# Patient Record
Sex: Female | Born: 1950 | Race: White | Hispanic: No | Marital: Married | Smoking: Never smoker
Health system: Southern US, Community
[De-identification: ages and names within clinical notes are randomized; demographics above are authoritative.]

## PROBLEM LIST (undated history)

## (undated) DIAGNOSIS — I10 Essential (primary) hypertension: Secondary | ICD-10-CM

## (undated) DIAGNOSIS — E785 Hyperlipidemia, unspecified: Secondary | ICD-10-CM

## (undated) HISTORY — DX: Hyperlipidemia, unspecified: E78.5

## (undated) HISTORY — DX: Essential (primary) hypertension: I10

## (undated) HISTORY — PX: VAGINAL HYSTERECTOMY: SUR661

---

## 2000-07-14 ENCOUNTER — Encounter: Admission: RE | Admit: 2000-07-14 | Discharge: 2000-07-14 | Payer: Self-pay | Admitting: Obstetrics and Gynecology

## 2000-07-14 ENCOUNTER — Encounter: Payer: Self-pay | Admitting: Obstetrics and Gynecology

## 2000-08-03 ENCOUNTER — Other Ambulatory Visit: Admission: RE | Admit: 2000-08-03 | Discharge: 2000-08-03 | Payer: Self-pay | Admitting: Obstetrics and Gynecology

## 2001-08-17 ENCOUNTER — Encounter: Payer: Self-pay | Admitting: Obstetrics and Gynecology

## 2001-08-17 ENCOUNTER — Encounter: Admission: RE | Admit: 2001-08-17 | Discharge: 2001-08-17 | Payer: Self-pay | Admitting: Obstetrics and Gynecology

## 2002-04-19 ENCOUNTER — Encounter: Payer: Self-pay | Admitting: Family Medicine

## 2002-04-19 ENCOUNTER — Encounter: Admission: RE | Admit: 2002-04-19 | Discharge: 2002-04-19 | Payer: Self-pay | Admitting: Obstetrics and Gynecology

## 2002-10-06 ENCOUNTER — Other Ambulatory Visit: Admission: RE | Admit: 2002-10-06 | Discharge: 2002-10-06 | Payer: Self-pay | Admitting: Obstetrics and Gynecology

## 2003-11-01 ENCOUNTER — Ambulatory Visit (HOSPITAL_COMMUNITY): Admission: RE | Admit: 2003-11-01 | Discharge: 2003-11-01 | Payer: Self-pay | Admitting: Gastroenterology

## 2004-06-07 ENCOUNTER — Encounter: Admission: RE | Admit: 2004-06-07 | Discharge: 2004-06-07 | Payer: Self-pay | Admitting: Family Medicine

## 2005-07-21 ENCOUNTER — Encounter: Admission: RE | Admit: 2005-07-21 | Discharge: 2005-07-21 | Payer: Self-pay | Admitting: Family Medicine

## 2005-08-05 ENCOUNTER — Encounter: Admission: RE | Admit: 2005-08-05 | Discharge: 2005-08-05 | Payer: Self-pay | Admitting: Family Medicine

## 2006-09-21 ENCOUNTER — Encounter: Admission: RE | Admit: 2006-09-21 | Discharge: 2006-09-21 | Payer: Self-pay | Admitting: Family Medicine

## 2007-10-08 ENCOUNTER — Encounter: Admission: RE | Admit: 2007-10-08 | Discharge: 2007-10-08 | Payer: Self-pay | Admitting: Family Medicine

## 2008-09-11 ENCOUNTER — Emergency Department (HOSPITAL_COMMUNITY): Admission: EM | Admit: 2008-09-11 | Discharge: 2008-09-11 | Payer: Self-pay | Admitting: Emergency Medicine

## 2008-11-01 ENCOUNTER — Encounter: Admission: RE | Admit: 2008-11-01 | Discharge: 2008-11-01 | Payer: Self-pay | Admitting: Family Medicine

## 2009-11-16 ENCOUNTER — Encounter: Admission: RE | Admit: 2009-11-16 | Discharge: 2009-11-16 | Payer: Self-pay | Admitting: Family Medicine

## 2009-11-19 ENCOUNTER — Encounter: Admission: RE | Admit: 2009-11-19 | Discharge: 2009-11-19 | Payer: Self-pay | Admitting: Family Medicine

## 2010-11-19 LAB — DIFFERENTIAL
Basophils Absolute: 0 10*3/uL (ref 0.0–0.1)
Basophils Relative: 0 % (ref 0–1)
Eosinophils Absolute: 0.3 10*3/uL (ref 0.0–0.7)
Eosinophils Relative: 3 % (ref 0–5)
Lymphocytes Relative: 31 % (ref 12–46)
Lymphs Abs: 3 10*3/uL (ref 0.7–4.0)
Monocytes Absolute: 0.7 10*3/uL (ref 0.1–1.0)
Monocytes Relative: 8 % (ref 3–12)
Neutro Abs: 5.8 10*3/uL (ref 1.7–7.7)
Neutrophils Relative %: 59 % (ref 43–77)

## 2010-11-19 LAB — CBC
HCT: 38.6 % (ref 36.0–46.0)
Hemoglobin: 13.3 g/dL (ref 12.0–15.0)
MCHC: 34.4 g/dL (ref 30.0–36.0)
MCV: 91.6 fL (ref 78.0–100.0)
Platelets: 203 10*3/uL (ref 150–400)
RBC: 4.21 MIL/uL (ref 3.87–5.11)
RDW: 12.8 % (ref 11.5–15.5)
WBC: 9.9 10*3/uL (ref 4.0–10.5)

## 2010-11-19 LAB — BASIC METABOLIC PANEL
BUN: 15 mg/dL (ref 6–23)
CO2: 24 mEq/L (ref 19–32)
Calcium: 9.2 mg/dL (ref 8.4–10.5)
Chloride: 105 mEq/L (ref 96–112)
Creatinine, Ser: 0.93 mg/dL (ref 0.4–1.2)
GFR calc Af Amer: >60 mL/min (ref >60)
GFR calc non Af Amer: >60 mL/min (ref >60)
Glucose, Bld: 164 mg/dL — ABNORMAL HIGH (ref 70–99)
Potassium: 4.1 mEq/L (ref 3.5–5.1)
Sodium: 136 mEq/L (ref 135–145)

## 2010-11-19 LAB — POCT CARDIAC MARKERS
CKMB, poc: <1 ng/mL — ABNORMAL LOW (ref 1.0–8.0)
Myoglobin, poc: 52.5 ng/mL (ref 12–200)
Troponin i, poc: <0.05 ng/mL (ref 0.00–0.09)

## 2010-12-20 NOTE — Op Note (Signed)
Julie Bradford, Julie Bradford                           ACCOUNT NO.:  [id number]   MEDICAL RECORD NO.:  [id number]                   PATIENT TYPE:  AMB   LOCATION:  ENDO                                 FACILITY:  [facility]   PHYSICIAN:  Anselmo Rod, M.D.               DATE OF BIRTH:  [birth year]   DATE OF PROCEDURE:  11/01/2003  DATE OF DISCHARGE:                                 OPERATIVE REPORT   PROCEDURE PERFORMED:  Screening colonoscopy.   ENDOSCOPIST:  Charna Elizabeth, M.D.   INSTRUMENT USED:  Olympus video colonoscope.   INDICATIONS FOR PROCEDURE:  The patient is a 60 year old white female with a  family history of colon cancer in a sister and personal history of rectal  bleeding.  Rule out colonic polyps, masses, hemorrhoids, etc.   PREPROCEDURE PREPARATION:  Informed consent was procured from the patient.  The patient was fasted for eight hours prior to the procedure and prepped  with a bottle of magnesium citrate and a gallon of GoLYTELY the night prior  to the procedure.   PREPROCEDURE PHYSICAL:  The patient had stable vital signs.  Neck supple.  Chest clear to auscultation.  S1 and S2 regular.  Abdomen soft with normal  bowel sounds.   DESCRIPTION OF PROCEDURE:  The patient was placed in left lateral decubitus  position and sedated with 75 mg of Demerol and 7.5 mg of Versed  intravenously.  Once the patient was adequately sedated and maintained on  low flow oxygen and continuous cardiac monitoring, the Olympus video  colonoscope was advanced from the rectum to the cecum.  The appendicular  orifice and ileocecal valve were clearly visualized and photographed.  The  terminal ileum appeared normal and without lesions.  Small internal  hemorrhoids were seen on retroflexion in the rectum.  There was no evidence  of diverticulosis.  No masses or polyps were identified.   IMPRESSION:  Normal colonoscopy up to the terminal ileum except for small  internal hemorrhoids.   RECOMMENDATIONS:  1. Repeat colorectal cancer screening is recommended in the next five years     unless the patient develops any abnormal symptoms in the interim.  2. Continue high fiber diet with liberal fluid intake.  3. Outpatient followup in the next two weeks for further recommendations.     If rectal bleeding persists in spite of soft stool, further evaluation     may be necessary.                                               Anselmo Rod, M.D.    JNM/MEDQ  D:  11/01/2003  T:  11/01/2003  Job:  [id number]   cc:   Laqueta Linden, M.D.  [address]  [place]  [place] [address]  Fax: [phone]

## 2011-07-19 ENCOUNTER — Ambulatory Visit (INDEPENDENT_AMBULATORY_CARE_PROVIDER_SITE_OTHER): Payer: BC Managed Care – PPO

## 2011-07-19 DIAGNOSIS — J00 Acute nasopharyngitis [common cold]: Secondary | ICD-10-CM

## 2011-07-19 DIAGNOSIS — B9789 Other viral agents as the cause of diseases classified elsewhere: Secondary | ICD-10-CM

## 2011-07-19 DIAGNOSIS — J019 Acute sinusitis, unspecified: Secondary | ICD-10-CM

## 2011-10-29 ENCOUNTER — Other Ambulatory Visit: Payer: Self-pay | Admitting: Family Medicine

## 2011-10-29 DIAGNOSIS — Z1231 Encounter for screening mammogram for malignant neoplasm of breast: Secondary | ICD-10-CM

## 2011-11-13 ENCOUNTER — Ambulatory Visit: Payer: Self-pay

## 2012-02-10 ENCOUNTER — Ambulatory Visit: Payer: Self-pay

## 2012-02-11 ENCOUNTER — Ambulatory Visit
Admission: RE | Admit: 2012-02-11 | Discharge: 2012-02-11 | Disposition: A | Discharge status: Home / Self Care | Payer: BC Managed Care – PPO | Source: Ambulatory Visit | Attending: Family Medicine | Admitting: Family Medicine

## 2012-02-11 DIAGNOSIS — Z1231 Encounter for screening mammogram for malignant neoplasm of breast: Secondary | ICD-10-CM

## 2014-05-03 ENCOUNTER — Ambulatory Visit: Payer: Medicare PPO | Admitting: Neurology

## 2014-09-27 ENCOUNTER — Other Ambulatory Visit: Payer: Self-pay | Admitting: Family Medicine

## 2014-09-27 DIAGNOSIS — Z1231 Encounter for screening mammogram for malignant neoplasm of breast: Secondary | ICD-10-CM

## 2014-09-27 DIAGNOSIS — M810 Age-related osteoporosis without current pathological fracture: Secondary | ICD-10-CM

## 2014-10-04 ENCOUNTER — Ambulatory Visit
Admission: RE | Admit: 2014-10-04 | Discharge: 2014-10-04 | Disposition: A | Discharge status: Home / Self Care | Payer: Medicare PPO | Source: Ambulatory Visit | Attending: Family Medicine | Admitting: Family Medicine

## 2014-10-04 DIAGNOSIS — M810 Age-related osteoporosis without current pathological fracture: Secondary | ICD-10-CM

## 2014-10-04 DIAGNOSIS — Z1231 Encounter for screening mammogram for malignant neoplasm of breast: Secondary | ICD-10-CM

## 2015-03-08 ENCOUNTER — Ambulatory Visit
Admission: RE | Admit: 2015-03-08 | Discharge: 2015-03-08 | Disposition: A | Discharge status: Home / Self Care | Payer: Medicare PPO | Source: Ambulatory Visit | Attending: *Deleted | Admitting: *Deleted

## 2015-03-08 ENCOUNTER — Other Ambulatory Visit: Payer: Self-pay | Admitting: *Deleted

## 2015-03-08 DIAGNOSIS — R32 Unspecified urinary incontinence: Secondary | ICD-10-CM

## 2015-11-27 ENCOUNTER — Encounter: Payer: Medicare Other | Attending: Family Medicine

## 2015-11-27 VITALS — Ht 63.0 in | Wt 164.0 lb

## 2015-11-27 DIAGNOSIS — E119 Type 2 diabetes mellitus without complications: Secondary | ICD-10-CM

## 2015-11-27 DIAGNOSIS — E118 Type 2 diabetes mellitus with unspecified complications: Secondary | ICD-10-CM | POA: Insufficient documentation

## 2015-11-27 NOTE — Progress Notes (Signed)
Patient was seen on 11/27/2015 for the first of a series of three diabetes self-management courses at the [facility].  Patient Education Plan per assessed needs and concerns is to attend four course education program for Diabetes Self Management Education.  The following learning objectives were met by the patient during this class:  Describe diabetes  State some common risk factors for diabetes  Defines the role of glucose and insulin  Identifies type of diabetes and pathophysiology  Describe the relationship between diabetes and cardiovascular risk  State the members of the Healthcare Team  States the rationale for glucose monitoring  State when to test glucose  State their individual Target Range  State the importance of logging glucose readings  Describe how to interpret glucose readings  Identifies A1C target  Explain the correlation between A1c and eAG values  State symptoms and treatment of high blood glucose  State symptoms and treatment of low blood glucose  Explain proper technique for glucose testing  Identifies proper sharps disposal  Handouts given during class include:  Living Well with Diabetes book  Carb Counting and Meal Planning book  Meal Plan Card  Carbohydrate guide  Meal planning worksheet  Low Sodium Flavoring Tips  The diabetes portion plate  A1c to eAG Conversion Chart  Diabetes Medications  Diabetes Recommended Care Schedule  Support Group  Diabetes Success Plan  Core Class Satisfaction Survey  Follow-Up Plan:  Attend core 2    

## 2015-12-04 ENCOUNTER — Encounter: Payer: Medicare Other | Attending: Family Medicine | Admitting: *Deleted

## 2015-12-04 DIAGNOSIS — E118 Type 2 diabetes mellitus with unspecified complications: Secondary | ICD-10-CM | POA: Insufficient documentation

## 2015-12-04 DIAGNOSIS — E119 Type 2 diabetes mellitus without complications: Secondary | ICD-10-CM

## 2015-12-06 ENCOUNTER — Encounter: Payer: Self-pay | Admitting: *Deleted

## 2015-12-06 NOTE — Progress Notes (Signed)

## 2015-12-11 ENCOUNTER — Encounter: Payer: Self-pay | Admitting: *Deleted

## 2015-12-11 ENCOUNTER — Encounter: Payer: Medicare Other | Admitting: *Deleted

## 2015-12-11 DIAGNOSIS — E119 Type 2 diabetes mellitus without complications: Secondary | ICD-10-CM

## 2015-12-11 DIAGNOSIS — E118 Type 2 diabetes mellitus with unspecified complications: Secondary | ICD-10-CM | POA: Diagnosis not present

## 2015-12-11 NOTE — Progress Notes (Signed)
Patient was seen on 12/11/15 for the third of a series of three diabetes self-management courses at the [facility].   Julie Bradford. State the amount of activity recommended for healthy living . Describe activities suitable for individual needs . Identify ways to regularly incorporate activity into daily life . Identify barriers to activity and ways to over come these barriers  Identify diabetes medications being personally used and their primary action for lowering glucose and possible side effects . Describe role of stress on blood glucose and develop strategies to address psychosocial issues . Identify diabetes complications and ways to prevent them  Explain how to manage diabetes during illness . Evaluate success in meeting personal goal . Establish 2-3 goals that they will plan to diligently work on until they return for the  [address]447-month follow-up visit  Goals:   I will be active 30 minutes or more 4 times a week  To help manage stress I will  meditate at least 3 times a week  Your patient has identified these potential barriers to change:  Motivation Stress  Your patient has identified their diabetes self-care support plan as  [facility] Support Group Family Education officer, [employer] Subscriptions On-line Resources Plan:  Attend Support Group as desired

## 2016-02-21 ENCOUNTER — Other Ambulatory Visit: Payer: Self-pay | Admitting: Gastroenterology

## 2016-02-21 DIAGNOSIS — R1310 Dysphagia, unspecified: Secondary | ICD-10-CM

## 2016-03-04 ENCOUNTER — Other Ambulatory Visit: Payer: Self-pay | Admitting: Family Medicine

## 2016-03-04 DIAGNOSIS — Z1231 Encounter for screening mammogram for malignant neoplasm of breast: Secondary | ICD-10-CM

## 2016-03-12 ENCOUNTER — Ambulatory Visit
Admission: RE | Admit: 2016-03-12 | Discharge: 2016-03-12 | Disposition: A | Discharge status: Home / Self Care | Payer: Medicare Other | Source: Ambulatory Visit | Attending: Gastroenterology | Admitting: Gastroenterology

## 2016-03-12 DIAGNOSIS — R1310 Dysphagia, unspecified: Secondary | ICD-10-CM

## 2016-03-19 ENCOUNTER — Encounter: Payer: Self-pay | Admitting: Radiology

## 2016-03-19 ENCOUNTER — Ambulatory Visit
Admission: RE | Admit: 2016-03-19 | Discharge: 2016-03-19 | Disposition: A | Discharge status: Home / Self Care | Payer: Medicare Other | Source: Ambulatory Visit | Attending: Family Medicine | Admitting: Family Medicine

## 2016-03-19 DIAGNOSIS — Z1231 Encounter for screening mammogram for malignant neoplasm of breast: Secondary | ICD-10-CM

## 2016-05-21 ENCOUNTER — Other Ambulatory Visit: Payer: Self-pay | Admitting: Family Medicine

## 2016-05-21 DIAGNOSIS — R1310 Dysphagia, unspecified: Secondary | ICD-10-CM

## 2016-05-26 ENCOUNTER — Inpatient Hospital Stay: Admission: RE | Admit: 2016-05-26 | Payer: Medicare Other | Source: Ambulatory Visit

## 2016-07-16 ENCOUNTER — Ambulatory Visit
Admission: RE | Admit: 2016-07-16 | Discharge: 2016-07-16 | Disposition: A | Discharge status: Home / Self Care | Payer: Medicare Other | Source: Ambulatory Visit | Attending: Family Medicine | Admitting: Family Medicine

## 2016-07-16 DIAGNOSIS — R1310 Dysphagia, unspecified: Secondary | ICD-10-CM

## 2016-07-16 MED ORDER — IOPAMIDOL (ISOVUE-300) INJECTION 61%
75.0000 mL | Freq: Once | INTRAVENOUS | Status: AC | PRN
  Administered 2016-07-16: 75 mL via INTRAVENOUS

## 2017-09-03 ENCOUNTER — Other Ambulatory Visit: Payer: Self-pay | Admitting: Family Medicine

## 2017-09-03 DIAGNOSIS — M810 Age-related osteoporosis without current pathological fracture: Secondary | ICD-10-CM

## 2017-09-04 ENCOUNTER — Other Ambulatory Visit: Payer: Self-pay | Admitting: Obstetrics and Gynecology

## 2017-09-04 ENCOUNTER — Other Ambulatory Visit: Payer: Self-pay | Admitting: Family Medicine

## 2017-09-04 DIAGNOSIS — Z1231 Encounter for screening mammogram for malignant neoplasm of breast: Secondary | ICD-10-CM

## 2017-09-22 ENCOUNTER — Inpatient Hospital Stay: Admission: RE | Admit: 2017-09-22 | Payer: Medicare Other | Source: Ambulatory Visit

## 2017-09-23 ENCOUNTER — Ambulatory Visit: Payer: Medicare Other

## 2018-08-19 ENCOUNTER — Ambulatory Visit
Admission: RE | Admit: 2018-08-19 | Discharge: 2018-08-19 | Disposition: A | Discharge status: Home / Self Care | Payer: Medicare Other | Source: Ambulatory Visit | Attending: Family Medicine | Admitting: Family Medicine

## 2018-08-19 DIAGNOSIS — Z1231 Encounter for screening mammogram for malignant neoplasm of breast: Secondary | ICD-10-CM

## 2018-08-19 DIAGNOSIS — M810 Age-related osteoporosis without current pathological fracture: Secondary | ICD-10-CM

## 2020-03-26 ENCOUNTER — Other Ambulatory Visit: Payer: Self-pay | Admitting: Family Medicine

## 2020-03-26 DIAGNOSIS — Z1231 Encounter for screening mammogram for malignant neoplasm of breast: Secondary | ICD-10-CM

## 2020-04-04 ENCOUNTER — Ambulatory Visit
Admission: RE | Admit: 2020-04-04 | Discharge: 2020-04-04 | Disposition: A | Discharge status: Home / Self Care | Payer: Medicare PPO | Source: Ambulatory Visit | Attending: Family Medicine | Admitting: Family Medicine

## 2020-04-04 ENCOUNTER — Other Ambulatory Visit: Payer: Self-pay

## 2020-04-04 DIAGNOSIS — Z1231 Encounter for screening mammogram for malignant neoplasm of breast: Secondary | ICD-10-CM

## 2020-04-05 ENCOUNTER — Ambulatory Visit: Payer: Medicare Other

## 2020-11-27 ENCOUNTER — Other Ambulatory Visit: Payer: Self-pay | Admitting: Physician Assistant

## 2020-11-27 DIAGNOSIS — E2839 Other primary ovarian failure: Secondary | ICD-10-CM

## 2021-06-05 ENCOUNTER — Other Ambulatory Visit: Payer: Self-pay | Admitting: Family Medicine

## 2021-06-05 DIAGNOSIS — Z1231 Encounter for screening mammogram for malignant neoplasm of breast: Secondary | ICD-10-CM

## 2021-06-08 ENCOUNTER — Ambulatory Visit
Admission: RE | Admit: 2021-06-08 | Discharge: 2021-06-08 | Disposition: A | Discharge status: Home / Self Care | Payer: Medicare PPO | Source: Ambulatory Visit | Attending: Family Medicine | Admitting: Family Medicine

## 2021-06-08 DIAGNOSIS — Z1231 Encounter for screening mammogram for malignant neoplasm of breast: Secondary | ICD-10-CM

## 2021-07-02 ENCOUNTER — Ambulatory Visit
Admission: RE | Admit: 2021-07-02 | Discharge: 2021-07-02 | Disposition: A | Discharge status: Home / Self Care | Payer: Medicare PPO | Source: Ambulatory Visit | Attending: Physician Assistant | Admitting: Physician Assistant

## 2021-07-02 DIAGNOSIS — E2839 Other primary ovarian failure: Secondary | ICD-10-CM

## 2022-05-14 IMAGING — MG MM DIGITAL SCREENING BILAT W/ TOMO AND CAD
8 series · 8 of 24 positions shown · non-contrast
Comparison: Previous exam(s).

CLINICAL DATA: Screening.

EXAM:
DIGITAL SCREENING BILATERAL MAMMOGRAM WITH TOMOSYNTHESIS AND CAD
TECHNIQUE: Bilateral screening digital craniocaudal and mediolateral oblique
mammograms were obtained. Bilateral screening digital breast
tomosynthesis was performed. The images were evaluated with
computer-aided detection.

[L CC synth-2D]
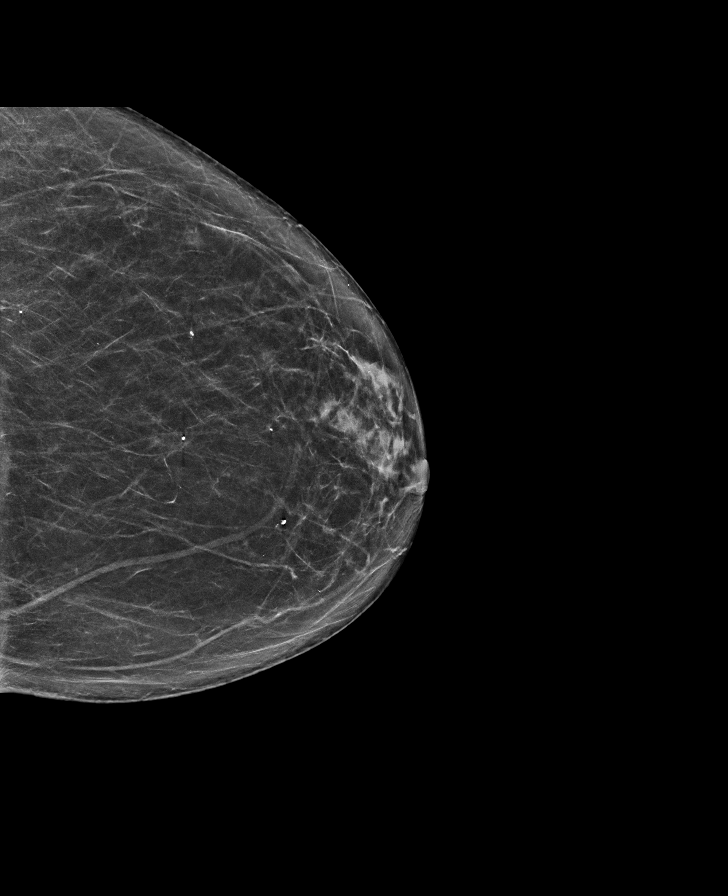

[R CC synth-2D]
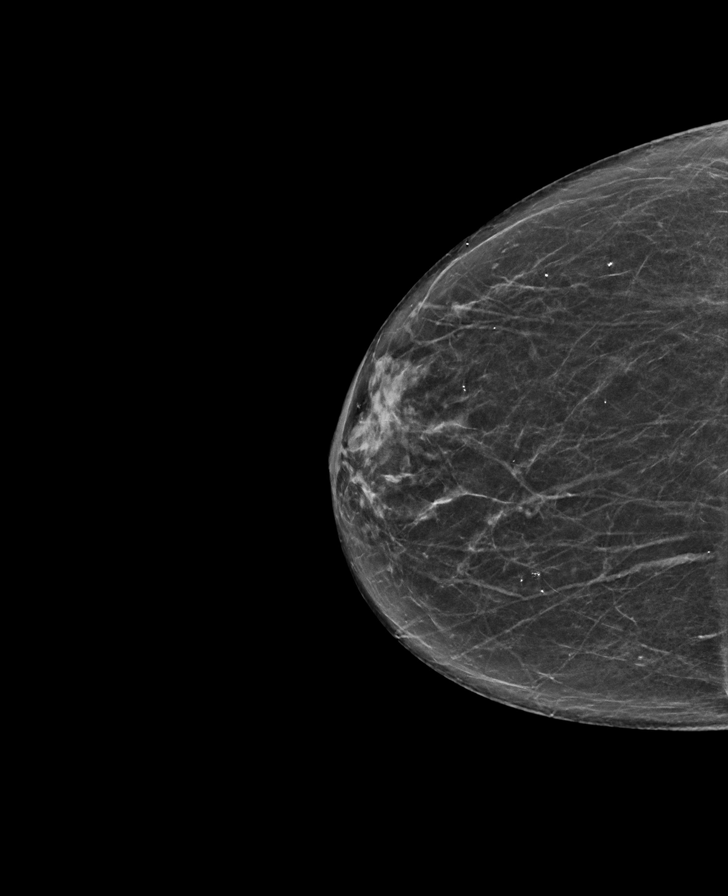

[R MLO synth-2D]
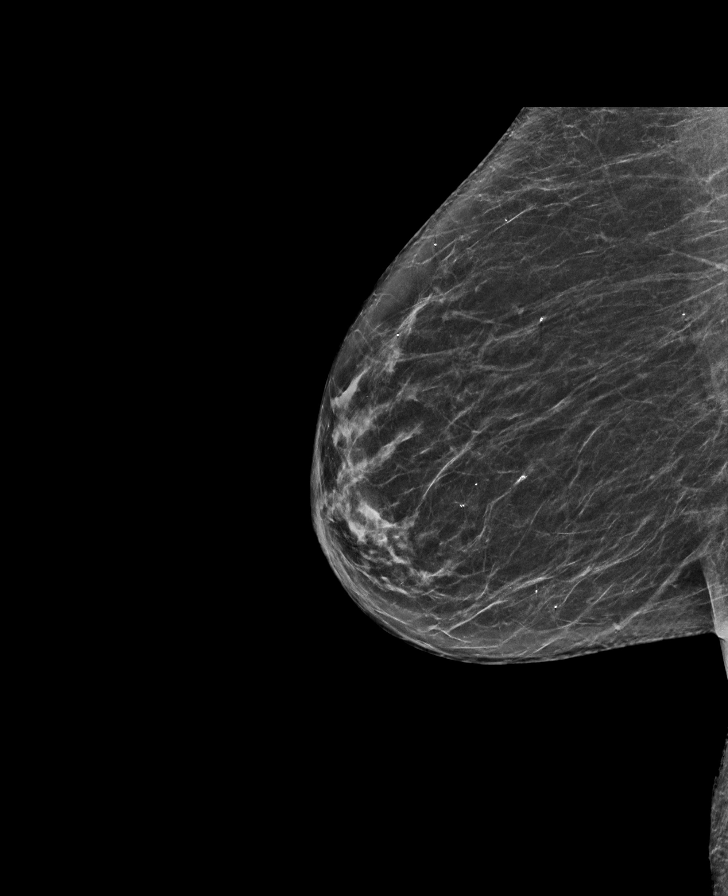

[L MLO synth-2D]
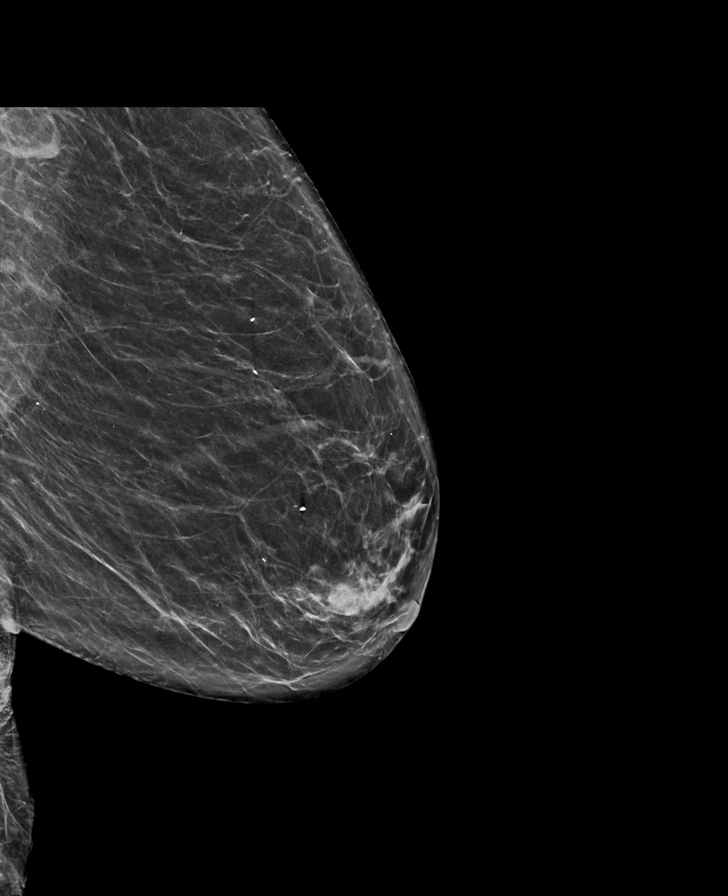

[L MLO tomo · tomo slice 35/70.0]
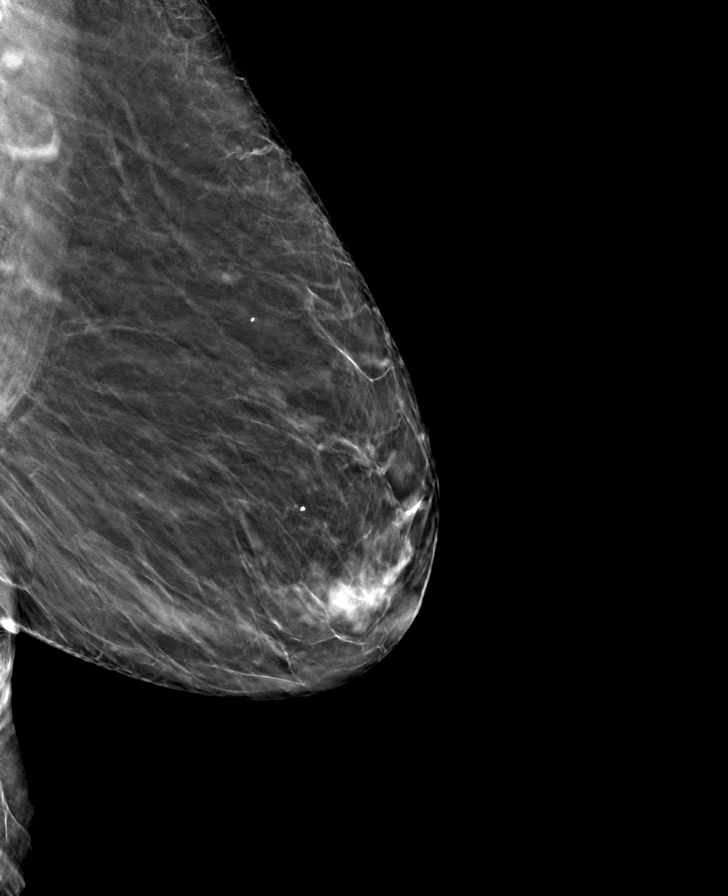

[R MLO tomo · tomo slice 33/66.0]
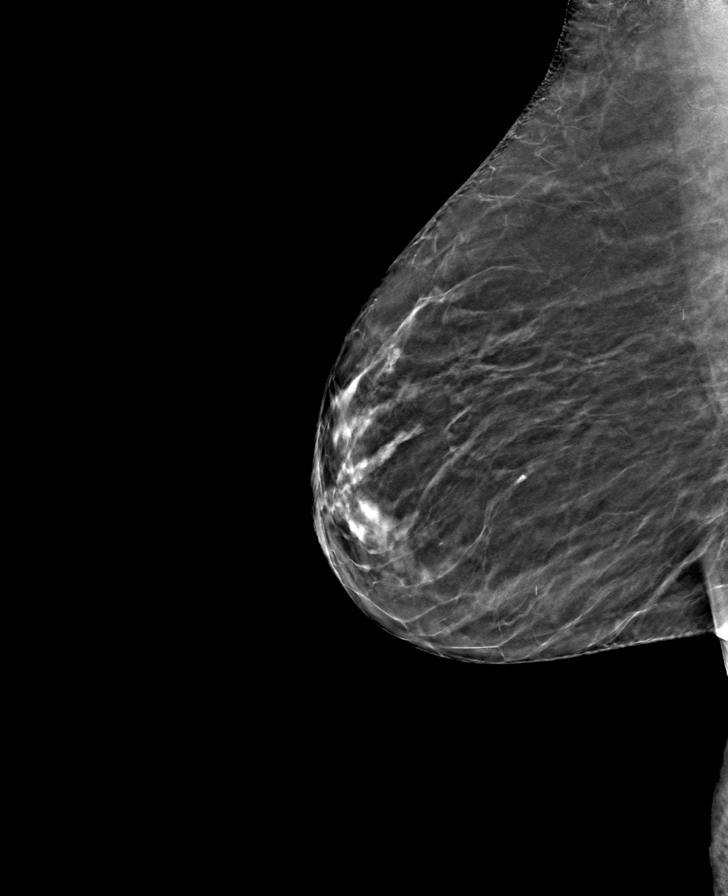

[R CC tomo · tomo slice 31/61.0]
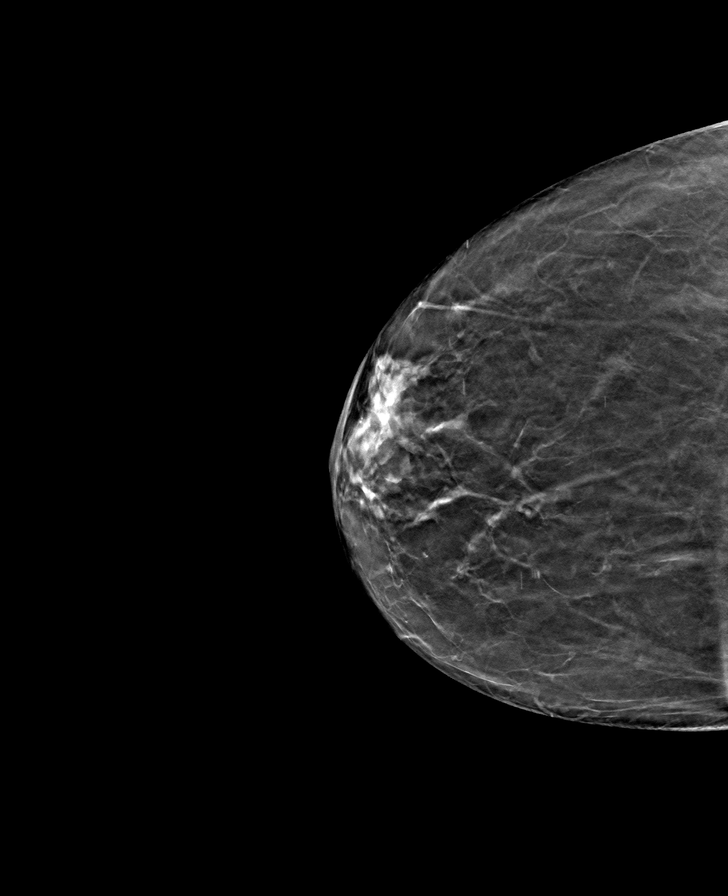

[L CC tomo · tomo slice 33/66.0]
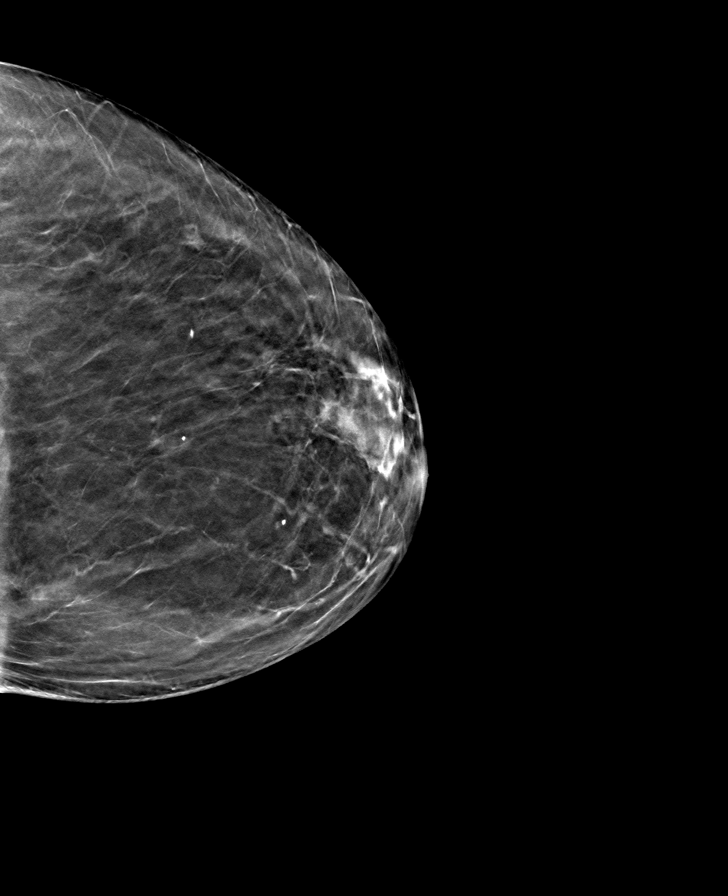

[8 of 24 positions shown; findings below may reference images not displayed]

ACR Breast Density Category b: There are scattered areas of
fibroglandular density.
FINDINGS: There are no findings suspicious for malignancy.
IMPRESSION: No mammographic evidence of malignancy. A result letter of this
screening mammogram will be mailed directly to the patient.

RECOMMENDATION:
Screening mammogram in one year. (Code:51-O-LD2)

BI-RADS CATEGORY  1: Negative.

## 2023-04-22 ENCOUNTER — Other Ambulatory Visit: Payer: Self-pay | Admitting: Nurse Practitioner

## 2023-04-22 DIAGNOSIS — M810 Age-related osteoporosis without current pathological fracture: Secondary | ICD-10-CM
